# Patient Record
Sex: Male | Born: 2000 | Hispanic: No | Marital: Single | Smoking: Never smoker
Health system: Southern US, Community
[De-identification: ages and names within clinical notes are randomized; demographics above are authoritative.]

---
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
## 2022-11-07 ENCOUNTER — Encounter (HOSPITAL_COMMUNITY): Payer: Self-pay | Admitting: Emergency Medicine

## 2022-11-07 ENCOUNTER — Other Ambulatory Visit: Payer: Self-pay

## 2022-11-07 ENCOUNTER — Emergency Department (HOSPITAL_COMMUNITY)
Admission: EM | Admit: 2022-11-07 | Discharge: 2022-11-08 | Disposition: A | Discharge status: Home / Self Care | Payer: BLUE CROSS/BLUE SHIELD | Attending: Emergency Medicine | Admitting: Emergency Medicine

## 2022-11-07 DIAGNOSIS — X500XXA Overexertion from strenuous movement or load, initial encounter: Secondary | ICD-10-CM | POA: Diagnosis not present

## 2022-11-07 DIAGNOSIS — M545 Low back pain, unspecified: Secondary | ICD-10-CM | POA: Insufficient documentation

## 2022-11-07 MED ORDER — ACETAMINOPHEN 500 MG PO TABS
1000.0000 mg | ORAL_TABLET | Freq: Once | ORAL | Status: AC
  Administered 2022-11-07: 1000 mg via ORAL
  Filled 2022-11-07: qty 2

## 2022-11-07 MED ORDER — OXYCODONE HCL 5 MG PO TABS
5.0000 mg | ORAL_TABLET | Freq: Once | ORAL | Status: AC
  Administered 2022-11-07: 5 mg via ORAL
  Filled 2022-11-07: qty 1

## 2022-11-07 MED ORDER — HYDROMORPHONE HCL 1 MG/ML IJ SOLN
0.5000 mg | Freq: Once | INTRAMUSCULAR | Status: AC
  Administered 2022-11-08: 0.5 mg via INTRAMUSCULAR
  Filled 2022-11-07: qty 1

## 2022-11-07 MED ORDER — METHOCARBAMOL 500 MG PO TABS: 500.0000 mg | ORAL_TABLET | Freq: Two times a day (BID) | ORAL | 0 refills | Status: AC

## 2022-11-07 MED ORDER — KETOROLAC TROMETHAMINE 15 MG/ML IJ SOLN
15.0000 mg | Freq: Once | INTRAMUSCULAR | Status: AC
  Administered 2022-11-07: 15 mg via INTRAMUSCULAR
  Filled 2022-11-07: qty 1

## 2022-11-07 MED ORDER — DIAZEPAM 5 MG PO TABS
5.0000 mg | ORAL_TABLET | Freq: Once | ORAL | Status: AC
  Administered 2022-11-07: 5 mg via ORAL
  Filled 2022-11-07: qty 1

## 2022-11-07 NOTE — ED Provider Notes (Signed)
[place] EMERGENCY DEPARTMENT AT [facility] Provider Note   CSN: [id number] Arrival date & time: 11/07/22  2013     History  Chief Complaint  Patient presents with   Back Pain    Derrick Simon is a 22 y.o. male.  22 yo M with a chief complaints of bilateral low back pain.  Going on for couple days and he went to the gym today to try and lift weights and had grabbed a 60 pound weights and had sudden worsening pain to his low back.  Since that has had difficulty doing anything but lying flat on the ground.  Pain with sitting and ambulating.  Denies radiation of pain.  Denies loss of bowel or bladder denies loss of Pyrtle sensation denies numbness or weakness to his legs.  Denies fevers.  Denies trauma.   Back Pain      Home Medications Prior to Admission medications   Medication Sig Start Date End Date Taking? Authorizing Provider  methocarbamol (ROBAXIN) 500 MG tablet Take 1 tablet (500 mg total) by mouth 2 (two) times daily. 11/07/22  Yes Melene Plan, DO      Allergies    Patient has no known allergies.    Review of Systems   Review of Systems  Musculoskeletal:  Positive for back pain.    Physical Exam Updated Vital Signs BP 131/67   Pulse 68   Temp 98.3 F (36.8 C) (Oral)   Resp 18   SpO2 99%  Physical Exam Vitals and nursing note reviewed.  Constitutional:      Appearance: He is well-developed.  HENT:     Head: Normocephalic and atraumatic.  Eyes:     Pupils: Pupils are equal, round, and reactive to light.  Neck:     Vascular: No JVD.  Cardiovascular:     Rate and Rhythm: Normal rate and regular rhythm.     Heart sounds: No murmur heard.    No friction rub. No gallop.  Pulmonary:     Effort: No respiratory distress.     Breath sounds: No wheezing.  Abdominal:     General: There is no distension.     Tenderness: There is no abdominal tenderness. There is no guarding or rebound.  Musculoskeletal:        General: Normal range of motion.      Cervical back: Normal range of motion and neck supple.     Comments: Pulse motor and sensation intact to bilateral lower extremities.  Reflex 2+ and equal.  No clonus.  Negative straight leg raise test bilaterally.  Skin:    Coloration: Skin is not pale.     Findings: No rash.  Neurological:     Mental Status: He is alert and oriented to person, place, and time.  Psychiatric:        Behavior: Behavior normal.     ED Results / Procedures / Treatments   Labs (all labs ordered are listed, but only abnormal results are displayed) Labs Reviewed - No data to display  EKG None  Radiology No results found.  Procedures Procedures    Medications Ordered in ED Medications  acetaminophen (TYLENOL) tablet 1,000 mg (1,000 mg Oral Given 11/07/22 2304)  ketorolac (TORADOL) 15 MG/ML injection 15 mg (15 mg Intramuscular Given 11/07/22 2304)  oxyCODONE (Oxy IR/ROXICODONE) immediate release tablet 5 mg (5 mg Oral Given 11/07/22 2304)  diazepam (VALIUM) tablet 5 mg (5 mg Oral Given 11/07/22 2304)    ED Course/ Medical Decision Making/  A&P                                 Medical Decision Making Risk OTC drugs. Prescription drug management.   22 yo M with a chief complaints of bilateral low back pain.  Musculoskeletal by history and physical.  Benign exam.  Will treat supportively.  PCP follow-up.  11:11 PM:  I have discussed the diagnosis/risks/treatment options with the patient and family.  Evaluation and diagnostic testing in the emergency department does not suggest an emergent condition requiring admission or immediate intervention beyond what has been performed at this time.  They will follow up with PCP. We also discussed returning to the ED immediately if new or worsening sx occur. We discussed the sx which are most concerning (e.g., sudden worsening pain, fever, inability to tolerate by mouth, cauda equina s/sx) that necessitate immediate return. Medications administered to the  patient during their visit and any new prescriptions provided to the patient are listed below.  Medications given during this visit Medications  acetaminophen (TYLENOL) tablet 1,000 mg (1,000 mg Oral Given 11/07/22 2304)  ketorolac (TORADOL) 15 MG/ML injection 15 mg (15 mg Intramuscular Given 11/07/22 2304)  oxyCODONE (Oxy IR/ROXICODONE) immediate release tablet 5 mg (5 mg Oral Given 11/07/22 2304)  diazepam (VALIUM) tablet 5 mg (5 mg Oral Given 11/07/22 2304)     The patient appears reasonably screen and/or stabilized for discharge and I doubt any other medical condition or other [facility] requiring further screening, evaluation, or treatment in the ED at this time prior to discharge.          Final Clinical Impression(s) / ED Diagnoses Final diagnoses:  Acute bilateral low back pain without sciatica    Rx / DC Orders ED Discharge Orders          Ordered    methocarbamol (ROBAXIN) 500 MG tablet  2 times daily        11/07/22 2236              Melene Plan, DO 11/07/22 2311

## 2022-11-07 NOTE — Discharge Instructions (Signed)
Your back pain is most likely due to a muscular strain.  There is been a lot of research on back pain, unfortunately the only thing that seems to really help is Tylenol and ibuprofen.  Relative rest is also important to not lift greater than 10 pounds bending or twisting at the waist.  Please follow-up with your family physician.  The other thing that really seems to benefit patients is physical therapy which your doctor may send you for.  Please return to the emergency department for new numbness or weakness to your arms or legs. Difficulty with urinating or urinating or pooping on yourself.  Also if you cannot feel toilet paper when you wipe or get a fever.   Take 4 over the counter ibuprofen tablets 3 times a day or 2 over-the-counter naproxen tablets twice a day for pain. Also take tylenol 1000mg(2 extra strength) four times a day.  Stretches can also help. https://www.youtube.com/watch?v=CdCClhtKH2Q 

## 2022-11-07 NOTE — ED Triage Notes (Signed)
Patient coming to ED for evaluation of lower back pain.  Reports back started aching on Sunday.  No recent injuries or hx of back pain.  States he was at the gym tonight and was attempting to lift 60 lbs.  Reports back pain intensified and is unable to sit or stand without pain.  No numbness or tingling reported.

## 2022-11-08 NOTE — ED Notes (Signed)
Patient reports slight improvement in pain and wanting to go home.  Parents in agreement

## 2022-11-08 NOTE — ED Notes (Signed)
Patient reports no improvement in pain or symptoms. MD made aware

## 2022-11-08 NOTE — ED Notes (Signed)
Patient and parents encouraged to have patient slow sit up from laying position after medication helps
